# Patient Record
Sex: Male | Born: 1980 | Race: White | Hispanic: Yes | Marital: Single | Smoking: Never smoker
Health system: Southern US, Community
[De-identification: ages and names within clinical notes are randomized; demographics above are authoritative.]

---

## 2008-05-17 ENCOUNTER — Emergency Department (HOSPITAL_COMMUNITY): Admission: EM | Admit: 2008-05-17 | Discharge: 2008-05-17 | Payer: Self-pay | Admitting: Emergency Medicine

## 2018-03-19 ENCOUNTER — Emergency Department: Payer: Worker's Compensation

## 2018-03-19 ENCOUNTER — Emergency Department
Admission: EM | Admit: 2018-03-19 | Discharge: 2018-03-19 | Disposition: A | Discharge status: Home / Self Care | Payer: Worker's Compensation | Attending: Emergency Medicine | Admitting: Emergency Medicine

## 2018-03-19 ENCOUNTER — Other Ambulatory Visit: Payer: Self-pay

## 2018-03-19 ENCOUNTER — Encounter: Payer: Self-pay | Admitting: Intensive Care

## 2018-03-19 DIAGNOSIS — M545 Low back pain, unspecified: Secondary | ICD-10-CM

## 2018-03-19 DIAGNOSIS — S20221A Contusion of right back wall of thorax, initial encounter: Secondary | ICD-10-CM | POA: Diagnosis not present

## 2018-03-19 DIAGNOSIS — Y999 Unspecified external cause status: Secondary | ICD-10-CM | POA: Diagnosis not present

## 2018-03-19 DIAGNOSIS — Y939 Activity, unspecified: Secondary | ICD-10-CM | POA: Diagnosis not present

## 2018-03-19 DIAGNOSIS — S3991XA Unspecified injury of abdomen, initial encounter: Secondary | ICD-10-CM | POA: Insufficient documentation

## 2018-03-19 DIAGNOSIS — Y929 Unspecified place or not applicable: Secondary | ICD-10-CM | POA: Insufficient documentation

## 2018-03-19 DIAGNOSIS — S92011A Displaced fracture of body of right calcaneus, initial encounter for closed fracture: Secondary | ICD-10-CM | POA: Diagnosis not present

## 2018-03-19 DIAGNOSIS — W12XXXA Fall on and from scaffolding, initial encounter: Secondary | ICD-10-CM | POA: Diagnosis not present

## 2018-03-19 DIAGNOSIS — S20309A Unspecified superficial injuries of unspecified front wall of thorax, initial encounter: Secondary | ICD-10-CM | POA: Insufficient documentation

## 2018-03-19 DIAGNOSIS — S99921A Unspecified injury of right foot, initial encounter: Secondary | ICD-10-CM | POA: Diagnosis present

## 2018-03-19 LAB — CBC WITH DIFFERENTIAL/PLATELET
BASOS ABS: 0 10*3/uL (ref 0–0.1)
Basophils Relative: 0 %
EOS PCT: 0 %
Eosinophils Absolute: 0 10*3/uL (ref 0–0.7)
HEMATOCRIT: 44.5 % (ref 40.0–52.0)
Hemoglobin: 15.3 g/dL (ref 13.0–18.0)
LYMPHS ABS: 0.8 10*3/uL — AB (ref 1.0–3.6)
LYMPHS PCT: 6 %
MCH: 32.3 pg (ref 26.0–34.0)
MCHC: 34.4 g/dL (ref 32.0–36.0)
MCV: 94.1 fL (ref 80.0–100.0)
MONO ABS: 0.9 10*3/uL (ref 0.2–1.0)
MONOS PCT: 7 %
NEUTROS ABS: 11.8 10*3/uL — AB (ref 1.4–6.5)
Neutrophils Relative %: 87 %
Platelets: 203 10*3/uL (ref 150–440)
RBC: 4.74 MIL/uL (ref 4.40–5.90)
RDW: 13.5 % (ref 11.5–14.5)
WBC: 13.5 10*3/uL — ABNORMAL HIGH (ref 3.8–10.6)

## 2018-03-19 LAB — BASIC METABOLIC PANEL
Anion gap: 6 (ref 5–15)
BUN: 23 mg/dL — ABNORMAL HIGH (ref 6–20)
CHLORIDE: 109 mmol/L (ref 98–111)
CO2: 24 mmol/L (ref 22–32)
Calcium: 9.1 mg/dL (ref 8.9–10.3)
Creatinine, Ser: 0.82 mg/dL (ref 0.61–1.24)
GFR calc Af Amer: >60 mL/min (ref >60)
GLUCOSE: 140 mg/dL — AB (ref 70–99)
POTASSIUM: 4 mmol/L (ref 3.5–5.1)
Sodium: 139 mmol/L (ref 135–145)

## 2018-03-19 LAB — URINALYSIS, COMPLETE (UACMP) WITH MICROSCOPIC
BACTERIA UA: NONE SEEN
BILIRUBIN URINE: NEGATIVE
Glucose, UA: NEGATIVE mg/dL
Hgb urine dipstick: NEGATIVE
KETONES UR: NEGATIVE mg/dL
LEUKOCYTES UA: NEGATIVE
Nitrite: NEGATIVE
Protein, ur: NEGATIVE mg/dL
SQUAMOUS EPITHELIAL / LPF: NONE SEEN (ref 0–5)
pH: 5 (ref 5.0–8.0)

## 2018-03-19 MED ORDER — IOHEXOL 300 MG/ML  SOLN
100.0000 mL | Freq: Once | INTRAMUSCULAR | Status: AC | PRN
  Administered 2018-03-19: 100 mL via INTRAVENOUS
  Filled 2018-03-19: qty 100

## 2018-03-19 MED ORDER — FENTANYL CITRATE (PF) 100 MCG/2ML IJ SOLN
100.0000 ug | Freq: Once | INTRAMUSCULAR | Status: AC
  Administered 2018-03-19: 100 ug via INTRAVENOUS
  Filled 2018-03-19: qty 2

## 2018-03-19 MED ORDER — HYDROMORPHONE HCL 1 MG/ML IJ SOLN
1.0000 mg | Freq: Once | INTRAMUSCULAR | Status: AC
  Administered 2018-03-19: 1 mg via INTRAVENOUS
  Filled 2018-03-19: qty 1

## 2018-03-19 MED ORDER — ONDANSETRON HCL 4 MG/2ML IJ SOLN
4.0000 mg | Freq: Once | INTRAMUSCULAR | Status: AC
Start: 2018-03-19 — End: 2018-03-19
  Administered 2018-03-19: 4 mg via INTRAVENOUS
  Filled 2018-03-19: qty 2

## 2018-03-19 MED ORDER — MELOXICAM 15 MG PO TABS: 15.0000 mg | ORAL_TABLET | Freq: Every day | ORAL | 0 refills | Status: DC

## 2018-03-19 MED ORDER — OXYCODONE-ACETAMINOPHEN 10-325 MG PO TABS: 1.0000 | ORAL_TABLET | Freq: Four times a day (QID) | ORAL | 0 refills | Status: DC | PRN

## 2018-03-19 MED ORDER — OXYCODONE-ACETAMINOPHEN 5-325 MG PO TABS
1.0000 | ORAL_TABLET | ORAL | Status: DC | PRN
  Administered 2018-03-19: 1 via ORAL
  Filled 2018-03-19 (×2): qty 1

## 2018-03-19 NOTE — ED Notes (Signed)
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 Verbally confirmed no testing or specimen collection necessary.

## 2018-03-19 NOTE — ED Notes (Signed)
See triage note  Brought in by co-worker s/p falling from approx 9 ft  From scaffold  Landed on feet  Having pain to mainly ro right foot/heel  Positive swelling   Unable to bear wt

## 2018-03-19 NOTE — Discharge Instructions (Signed)
Do not put any weight on your right foot. Keep your foot elevated. Apply ice pack on top of splint 20 minutes per hour. If the pain gets bad and is not controlled by the pain medicine, come back to the ER.

## 2018-03-19 NOTE — ED Triage Notes (Signed)
Patient reports falling at work on Right ankle. Ankle swollen. Unable to put any weight on ankle.

## 2018-03-19 NOTE — ED Notes (Signed)
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  Pt unable to bear weight to right ankle.

## 2018-03-19 NOTE — ED Provider Notes (Signed)
[facility] Emergency Department Provider Note  ____________________________________________   First MD Initiated Contact with Patient 03/19/18 1607     (approximate)  I have reviewed the triage vital signs and the nursing notes.   HISTORY  Chief Complaint Ankle Pain (right)   HPI Peter Black is a 37 y.o. male who presents to the emergency department for treatment and evaluation of right ankle and foot pain after falling from 9 or 10 feet while up on a scaffold.  He lost his balance and fell.  He landed directly on both feet.  He has been unable to bear any weight on his left foot.   History reviewed. No pertinent past medical history.  There are no active problems to display for this patient.   History reviewed. No pertinent surgical history.  Prior to Admission medications   Medication Sig Start Date End Date Taking? Authorizing Provider  meloxicam (MOBIC) 15 MG tablet Take 1 tablet (15 mg total) by mouth daily. 03/19/18   Mesa Janus, Rulon Eisenmenger B, FNP  oxyCODONE-acetaminophen (PERCOCET) 10-325 MG tablet Take 1 tablet by mouth every 6 (six) hours as needed for pain. 03/19/18 03/19/19  Chinita Pester, FNP    Allergies Patient has no known allergies.  History reviewed. No pertinent family history.  Social History Social History   Tobacco Use  . Smoking status: Never Smoker  . Smokeless tobacco: Never Used  Substance Use Topics  . Alcohol use: Yes    Comment: occ  . Drug use: Never    Review of Systems  Constitutional: No fever/chills Eyes: No visual changes. ENT: No sore throat. Cardiovascular: Denies chest pain. Respiratory: Denies shortness of breath. Gastrointestinal: No abdominal pain.  No nausea, no vomiting.  No diarrhea.  No constipation. Genitourinary: Negative for dysuria. Musculoskeletal: Positive for back pain. Skin: Positive for right flank ecchymosis. Neurological: Negative for headaches, focal weakness or  numbness. ____________________________________________   PHYSICAL EXAM:  VITAL SIGNS: ED Triage Vitals  Enc Vitals Group     BP 03/19/18 1538 129/79     Pulse Rate 03/19/18 1538 81     Resp 03/19/18 1538 16     Temp 03/19/18 1538 99.2 F (37.3 C)     Temp Source 03/19/18 1538 Oral     SpO2 03/19/18 1538 96 %     Weight 03/19/18 1539 182 lb (82.6 kg)     Height 03/19/18 1539 5' 6.5" (1.689 m)     Head Circumference --      Peak Flow --      Pain Score 03/19/18 1539 10     Pain Loc --      Pain Edu? --      Excl. in GC? --     Constitutional: Alert and oriented. Well appearing and in no acute distress. Eyes: Conjunctivae are normal. Head: Atraumatic. Nose: No congestion/rhinnorhea. Mouth/Throat: Mucous membranes are moist.  Oropharynx non-erythematous. Neck: No stridor.   Cardiovascular: Normal rate, regular rhythm. Grossly normal heart sounds.  Good peripheral circulation. Respiratory: Normal respiratory effort.  No retractions. Lungs CTAB. Gastrointestinal: Soft and diffusely tender over the right abdomen. No distention. No abdominal bruits. No CVA tenderness. Musculoskeletal: No focal midline tenderness over the cervical, thoracic, or lumbar spine. Diffuse edema over the right ankle and foot. Full ROM of the right and left knee. No pain with pelvic rock.  Neurologic:  Normal speech and language. No gross focal neurologic deficits are appreciated. Skin:  Ecchymosis over the right flank with scattered abrasions. Psychiatric:  Mood and affect are normal. Speech and behavior are normal.  ____________________________________________   LABS (all labs ordered are listed, but only abnormal results are displayed)  Labs Reviewed  BASIC METABOLIC PANEL - Abnormal; Notable for the following components:      Result Value   Glucose, Bld 140 (*)    BUN 23 (*)    All other components within normal limits  CBC WITH DIFFERENTIAL/PLATELET - Abnormal; Notable for the following  components:   WBC 13.5 (*)    Neutro Abs 11.8 (*)    Lymphs Abs 0.8 (*)    All other components within normal limits  URINALYSIS, COMPLETE (UACMP) WITH MICROSCOPIC - Abnormal; Notable for the following components:   Color, Urine YELLOW (*)    APPearance CLEAR (*)    Specific Gravity, Urine >1.046 (*)    All other components within normal limits   ____________________________________________  EKG  Not indicated. ____________________________________________  RADIOLOGY  ED MD interpretation: Comminuted right calcaneal fracture.  All other images are negative for acute findings.  Official radiology report(s): Dg Cervical Spine 2-3 Views  Result Date: 03/19/2018 CLINICAL DATA:  Patient slipped off 9 foot scaffold and presents with neck and low back pain. EXAM: CERVICAL SPINE - 2-3 VIEW COMPARISON:  None. FINDINGS: There is no evidence of cervical spine fracture or prevertebral soft tissue swelling. Alignment is normal. No other significant bone abnormalities are identified. IMPRESSION: Negative cervical spine radiographs. Electronically Signed   By: Tollie Eth M.D.   On: 03/19/2018 18:49   Dg Lumbar Spine 2-3 Views  Result Date: 03/19/2018 CLINICAL DATA:  Low back pain after fall from scaffolding. EXAM: LUMBAR SPINE - 2-3 VIEW COMPARISON:  Same day CT of the abdomen and pelvis. FINDINGS: There is no evidence of lumbar spine fracture. Small riblets of L1 bilaterally. Alignment is normal. Slight relative disc flattening L4-5 and L5-S1. No listhesis or pars defects. Contrast noted within the renal collecting system. IMPRESSION: Slight disc space narrowing L4-5 and L5-S1. No acute osseous abnormality. Electronically Signed   By: Tollie Eth M.D.   On: 03/19/2018 18:52   Dg Ankle Complete Right  Result Date: 03/19/2018 CLINICAL DATA:  RIGHT ankle injury. EXAM: RIGHT ANKLE - COMPLETE 3+ VIEW COMPARISON:  None. FINDINGS: Soft tissue swelling overlying the lateral malleolus. Osseous structures  about the RIGHT ankle appear intact and normally aligned. Ankle mortise is symmetric. Fracture lines are present within the proximal and mid calcaneus, possible fracture extension to the posterior calcaneus. IMPRESSION: 1. Displaced/comminuted fractures of the anterior/mid calcaneus, with possible extension of fracture line to the posterior calcaneus. Recommend CT to evaluate full extent. 2. Osseous structures about the RIGHT ankle appear intact and normally aligned. 3. Soft tissue swelling. Electronically Signed   By: Bary Richard M.D.   On: 03/19/2018 16:45   Ct Chest W Contrast  Result Date: 03/19/2018 CLINICAL DATA:  Fall from approximately 9 feet, landed on feet. Abdominal trauma, blunt, stable. EXAM: CT CHEST, ABDOMEN, AND PELVIS WITH CONTRAST TECHNIQUE: Multidetector CT imaging of the chest, abdomen and pelvis was performed following the standard protocol during bolus administration of intravenous contrast. CONTRAST:  OMNIPAQUE IOHEXOL 300 MG/ML  SOLN COMPARISON:  None. FINDINGS: CT CHEST FINDINGS Cardiovascular: Thoracic aorta appears intact and normal in configuration. Heart size is normal. No pericardial effusion. Mediastinum/Nodes: No hemorrhage or edema within the mediastinum. No mass or enlarged lymph nodes within the mediastinum or perihilar regions. Esophagus appears normal. Trachea and central bronchi are unremarkable. Lungs/Pleura: Lungs are clear.  No pleural effusion or pneumothorax. Musculoskeletal: No osseous fracture or dislocation seen. CT ABDOMEN PELVIS FINDINGS Hepatobiliary: No hepatic injury or perihepatic hematoma. Gallbladder is unremarkable Pancreas: Unremarkable. No pancreatic ductal dilatation or surrounding inflammatory changes. Spleen: No splenic injury or perisplenic hematoma. Adrenals/Urinary Tract: No adrenal hemorrhage or renal injury identified. Bladder is unremarkable. Stomach/Bowel: No dilated large or small bowel loops. No bowel wall thickening or evidence of bowel  wall injury. Appendix is normal. Stomach is unremarkable. Vascular/Lymphatic: Abdominal aorta appears intact and normal in configuration. No evidence of vascular injury within the abdomen or pelvis. No enlarged lymph nodes seen in the abdomen or pelvis. Reproductive: Prostate is unremarkable. Other: No free fluid or hemorrhage within the abdomen or pelvis. No free intraperitoneal air. Musculoskeletal: No osseous fracture or dislocation seen. Incidental note made of a partial sacralization of the lowest lumbar vertebral body on the LEFT. IMPRESSION: 1. No acute findings within the chest, abdomen or pelvis. 2. No osseous fracture or dislocation seen. Dedicated thoracic spine CT report to follow. Electronically Signed   By: Bary Richard M.D.   On: 03/19/2018 18:22   Ct Abdomen Pelvis W Contrast  Result Date: 03/19/2018 CLINICAL DATA:  Fall from approximately 9 feet, landed on feet. Abdominal trauma, blunt, stable. EXAM: CT CHEST, ABDOMEN, AND PELVIS WITH CONTRAST TECHNIQUE: Multidetector CT imaging of the chest, abdomen and pelvis was performed following the standard protocol during bolus administration of intravenous contrast. CONTRAST:  OMNIPAQUE IOHEXOL 300 MG/ML  SOLN COMPARISON:  None. FINDINGS: CT CHEST FINDINGS Cardiovascular: Thoracic aorta appears intact and normal in configuration. Heart size is normal. No pericardial effusion. Mediastinum/Nodes: No hemorrhage or edema within the mediastinum. No mass or enlarged lymph nodes within the mediastinum or perihilar regions. Esophagus appears normal. Trachea and central bronchi are unremarkable. Lungs/Pleura: Lungs are clear.  No pleural effusion or pneumothorax. Musculoskeletal: No osseous fracture or dislocation seen. CT ABDOMEN PELVIS FINDINGS Hepatobiliary: No hepatic injury or perihepatic hematoma. Gallbladder is unremarkable Pancreas: Unremarkable. No pancreatic ductal dilatation or surrounding inflammatory changes. Spleen: No splenic injury or  perisplenic hematoma. Adrenals/Urinary Tract: No adrenal hemorrhage or renal injury identified. Bladder is unremarkable. Stomach/Bowel: No dilated large or small bowel loops. No bowel wall thickening or evidence of bowel wall injury. Appendix is normal. Stomach is unremarkable. Vascular/Lymphatic: Abdominal aorta appears intact and normal in configuration. No evidence of vascular injury within the abdomen or pelvis. No enlarged lymph nodes seen in the abdomen or pelvis. Reproductive: Prostate is unremarkable. Other: No free fluid or hemorrhage within the abdomen or pelvis. No free intraperitoneal air. Musculoskeletal: No osseous fracture or dislocation seen. Incidental note made of a partial sacralization of the lowest lumbar vertebral body on the LEFT. IMPRESSION: 1. No acute findings within the chest, abdomen or pelvis. 2. No osseous fracture or dislocation seen. Dedicated thoracic spine CT report to follow. Electronically Signed   By: Bary Richard M.D.   On: 03/19/2018 18:22   Ct Foot Right Wo Contrast  Result Date: 03/19/2018 CLINICAL DATA:  Fall from 9 foot scaffold. EXAM: CT OF THE RIGHT FOOT WITHOUT CONTRAST TECHNIQUE: Multidetector CT imaging of the right foot was performed according to the standard protocol. Multiplanar CT image reconstructions were also generated. COMPARISON:  None. FINDINGS: Markedly displaced/comminuted fractures throughout the calcaneus, most significant displacement within the central and lateral aspects of the mid calcaneus, up to 1.4 cm fracture diastasis. Fracture lines extend to the posterior cortex of the calcaneus at multiple sites, superior to inferior. There is fracture extension  into the anterior third of the calcaneus, however, there is no fracture extension to the anterior margin of the sustentacular talus. Alignment at the calcaneocuboid joint space is normal. There is at least mild associated diastasis at the middle and posterior subtalar joint spaces. Osseous  structures of the midfoot appear intact and normally aligned throughout. Metatarsal bones and phalanges appear intact and normally aligned throughout. Talus appears intact and normally aligned. IMPRESSION: 1. Markedly displaced/comminuted fractures extending throughout the calcaneus, as detailed above. 2. Osseous structures of the midfoot and forefoot appear intact and normally aligned throughout. Electronically Signed   By: Bary Richard M.D.   On: 03/19/2018 18:31   Ct T-spine No Charge  Result Date: 03/19/2018 CLINICAL DATA:  Fall from 9 foot height.  Acute back pain. EXAM: CT THORACIC SPINE WITHOUT CONTRAST TECHNIQUE: Multidetector CT images of the thoracic were obtained using the standard protocol without intravenous contrast. COMPARISON:  None. FINDINGS: Alignment is normal. No fracture line or displaced fracture fragment. No compression fracture deformity. Visualized portions of the posterior ribs appear intact and normally aligned. The immediate paravertebral soft tissues are unremarkable. No paravertebral hemorrhage or edema. Minimal degenerative spurring within the midthoracic spine. Disc spaces appear otherwise well-preserved throughout. No significant central canal stenosis at any level. IMPRESSION: No acute or significant findings. No fracture or dislocation within the thoracic spine. Electronically Signed   By: Bary Richard M.D.   On: 03/19/2018 18:25    ____________________________________________   PROCEDURES  Procedure(s) performed:   .Splint Application Date/Time: 03/19/2018 9:05 PM Performed by: Lina Sayre, NT Authorized by: Chinita Pester, FNP   Consent:    Consent obtained:  Verbal   Consent given by:  Patient   Risks discussed:  Numbness and pain Pre-procedure details:    Sensation:  Normal Procedure details:    Laterality:  Right   Location:  Foot   Foot:  R foot   Splint type:  Short leg   Supplies:  Elastic bandage, cotton padding and  Ortho-Glass Post-procedure details:    Pain:  Unchanged   Sensation:  Normal   Patient tolerance of procedure:  Tolerated well, no immediate complications    Critical Care performed: No  ____________________________________________   INITIAL IMPRESSION / ASSESSMENT AND PLAN / ED COURSE  As part of my medical decision making, I reviewed the following data within the electronic MEDICAL RECORD NUMBER Interpreter needed   37 year old male presents to the emergency department after slipping off a scaffold.  He landed on both feet and then fell backward.  Do to the mechanism of injury and exam findings, CT chest abdomen and pelvis has been requested as well as plain films of the cervical, thoracic, lumbar spine.  Case was discussed with Dr. Orland Jarred who advises to place an Ace bandage over the foot and ankle then apply OCL.  He advises that Dr. Ether Griffins is the provider that will see him in follow-up due to calcaneal fracture for which he is able to further evaluate.  Results of all the testing was discussed with the patient and his family via interpreter.  The patient is aware that he would need to call Dr. Irene Limbo office tomorrow to schedule an appointment.      ____________________________________________   FINAL CLINICAL IMPRESSION(S) / ED DIAGNOSES  Final diagnoses:  Acute lumbar back pain  Closed displaced fracture of body of right calcaneus, initial encounter  Contusion of right back wall of thorax, initial encounter     ED Discharge Orders  Ordered    oxyCODONE-acetaminophen (PERCOCET) 10-325 MG tablet  Every 6 hours PRN     03/19/18 2050    meloxicam (MOBIC) 15 MG tablet  Daily     03/19/18 2050           Note:  This document was prepared using Dragon voice recognition software and may include unintentional dictation errors.    Chinita Pesterriplett, Rashod Gougeon B, FNP 03/19/18 2116    Dionne BucySiadecki, Sebastian, MD 03/19/18 [phone]

## 2018-03-23 ENCOUNTER — Ambulatory Visit (INDEPENDENT_AMBULATORY_CARE_PROVIDER_SITE_OTHER): Payer: Worker's Compensation | Admitting: Podiatry

## 2018-03-23 ENCOUNTER — Other Ambulatory Visit: Payer: Self-pay

## 2018-03-23 ENCOUNTER — Encounter: Payer: Self-pay | Admitting: Podiatry

## 2018-03-23 VITALS — BP 101/54 | HR 51

## 2018-03-23 DIAGNOSIS — S92011A Displaced fracture of body of right calcaneus, initial encounter for closed fracture: Secondary | ICD-10-CM

## 2018-03-23 DIAGNOSIS — R600 Localized edema: Secondary | ICD-10-CM

## 2018-03-23 MED ORDER — OXYCODONE-ACETAMINOPHEN 10-325 MG PO TABS: 1.0000 | ORAL_TABLET | ORAL | 0 refills | Status: DC | PRN

## 2018-03-25 NOTE — Progress Notes (Signed)
Subjective:   Patient ID: Peter Black, male   DOB: 37 y.o.   MRN: [id number]   HPI Patient presents with interpreter after suffering a severe injury to the right ankle at work where he fell off a ladder while working last Thursday.  He was seen in the emergency room and had a splint put on and was sent straight to us knowing that he is getting need surgery on his heel bone.  Patient is nonweightbearing and patient does not smoke and likes to be active   Review of Systems  All other systems reviewed and are negative.       Objective:  Physical Exam  Constitutional: He appears well-developed and well-nourished.  Cardiovascular: Intact distal pulses.  Pulmonary/Chest: Effort normal.  Musculoskeletal: Normal range of motion.  Neurological: He is alert.  Skin: Skin is warm.  Nursing note and vitals reviewed.   Neurovascular status was found to be intact with significant swelling of the right heel with severe pain noted and injury.  He presents with x-rays today that were evaluated and showed a severe comminuted fracture of the right calcaneus with depression of the sustentaculum tali and loss of integrity currently of the subtalar joint.  Everything is very tender and the edema is +2 pitting currently.  He has negative Homans sign and no breaks in his skin     Assessment:  Severe comminuted fracture of his right calcaneus at work sustained     Plan:  H&P and reviewed this case with several other physicians in the group.  I reviewed it with patient at great length and we discussed the nature of this fracture and the fact that it will not heal without surgery and that the calcaneus will need to be lifted in order to try to provide integrity and that long-term it is possible he may require a subtalar joint fusion.  Patient wants surgery and first we need to get the swelling down and we need to get the weight off of this foot so I did go ahead and I placed him into an Unna boot  to try to provide for reduced edema and I applied air fracture walker to try to stabilize the fracture.  Patient will be seen back in 1 week and will tentatively be scheduled for surgery in the next 10 days and this will be provided by several doctors in our group who will be available

## 2018-03-26 ENCOUNTER — Telehealth: Payer: Self-pay | Admitting: *Deleted

## 2018-03-26 NOTE — Telephone Encounter (Signed)
I called and left the patient a message that Dr. Charlsie Merlesegal was referring him to Dr. Gala LewandowskyBrent Evans and that he had an appointment with Dr. Logan BoresEvans on Monday, 03/30/2018, at 9:45 am.  I also informed him that we have him scheduled for surgery on Thursday, 04/02/2018, at 7 am.  I told him we will give him all the detailed information when he's here for his appointment on Monday.  I asked him to call if he has any questions.  Mr. Peter Black called me back.  I informed him about the referral made by Dr. Charlsie Merlesegal to Dr. Logan BoresEvans, as well as the surgery date of 04/02/2018.  I asked him for the name of the adjustor for his worker's compensation case.  He did not know the name but said he works for [employer] and that [employer] insurance should cover his procedure.

## 2018-03-27 NOTE — Telephone Encounter (Signed)
I left a message for Kasandra KnudsenCarol Jakubowski to give me a call back.  I informed her that the patient is scheduled for surgery on Thursday, 04/02/2018.  We need to get authorization for the surgery.

## 2018-03-30 ENCOUNTER — Ambulatory Visit (INDEPENDENT_AMBULATORY_CARE_PROVIDER_SITE_OTHER): Payer: Worker's Compensation | Admitting: Podiatry

## 2018-03-30 DIAGNOSIS — S92011A Displaced fracture of body of right calcaneus, initial encounter for closed fracture: Secondary | ICD-10-CM | POA: Diagnosis not present

## 2018-03-30 DIAGNOSIS — R600 Localized edema: Secondary | ICD-10-CM

## 2018-03-30 NOTE — Progress Notes (Signed)
   HPI: 37 year old male presents the office today for surgical consultation regarding calcaneal fracture with comminution to the right foot.  Patient sustained a work-related injury when he fell off some scaffolding.  DOI : 03/19/18.  Patient was seen in the emergency room and had a splint placed and [facility].  CT scan was ordered demonstrating comminution with severe fragmentation of the calcaneus with displacement.  Patient is already scheduled for surgery on Thursday, 04/02/2018.  He presents today for surgical consultation and to evaluate the edema to the right ankle and foot.  No past medical history on file.   Physical Exam: General: The patient is alert and oriented x3 in no acute distress.  Dermatology: Skin is warm, dry and supple bilateral lower extremities. Negative for open lesions or macerations.  Vascular: Palpable pedal pulses bilaterally.  Capillary refill within normal limits.  Edema appears to be somewhat improved since the date of injury.  Okay to proceed with surgical intervention at this time.  Ecchymosis also noted throughout the right foot and ankle  Neurological: Epicritic and protective threshold grossly intact bilaterally.   Musculoskeletal Exam: Pain throughout the right foot.  Patient is currently nonweightbearing in the immobilization cam boot using crutches  Radiographic Exam:  Normal osseous mineralization. Joint spaces preserved. No fracture/dislocation/boney destruction.    Assessment: 1.  Displaced calcaneal fracture right with comminution   Plan of Care:  1. Patient evaluated. X-Rays and CT reviewed.  2.  Surgery scheduled for Thursday, 04/02/2018 7 AM.  Authorization for surgery has already been initiated and approved 3.  I did explain to the patient that he will require several months refraining from work postoperatively.  We will likely place an immobilization fiberglass cast on the  patient's foot for the first 4 weeks.  Strict nonweightbearing for approximately 6 to 8 weeks. 4.  Return to clinic 1 week postop      Felecia ShellingBrent M. Nala Kachel, DPM [facility]  Dr. Felecia ShellingBrent M. Melani Brisbane, DPM    [address]                                        [place], [place] [id number]                Office [phone]  Fax [phone]

## 2018-03-31 ENCOUNTER — Telehealth: Payer: Self-pay | Admitting: *Deleted

## 2018-03-31 NOTE — Telephone Encounter (Signed)
"  Does Dr. Logan BoresEvans need anything, implants or whatever, for his procedure that is scheduled for Thursday?  If so, we need to get the order in so we will have it by Thursday.  Please call and let LuAnn or Renee know.

## 2018-03-31 NOTE — Telephone Encounter (Signed)
"  My name is Peter RegalCarol calling from [employer].  I am working on a claim for [employer] Group[employer] Black.  He's scheduled for a surgery on 04/02/2018.  I have not received any medical records for him or anything.  Can you fax that information to me?"  Yes, I will fax it.  What is your fax number?  "It is [phone].  Make sure when you send the notes to put the claim number on the front page."  I will send the now.

## 2018-03-31 NOTE — Telephone Encounter (Signed)
I asked the administrators at the front desk to let me know when the patient comes in,so we can assure that the consent form is explained in detail.

## 2018-03-31 NOTE — Telephone Encounter (Signed)
I called and asked Mr. Peter Black to stop by the office to sign his consent forms prior to surgery.  He informed me that he was here yesterday and that he signed them.  I informed him I did not receive them.  He said he signed them when he left yesterday.  He said he did not have a way to get here because his wife was at work.  I asked him if he could find someone else to bring him.  He said he would try to find someone to bring him today.  I informed him that he can come either today or tomorrow.  I left the consent form at the front desk.

## 2018-03-31 NOTE — Telephone Encounter (Signed)
I don't think it is a great idea to leave at the front desk for him to sign. I would like to make sure that the consent form has been thoroughly reviewed with the patient.

## 2018-04-01 NOTE — Telephone Encounter (Signed)
Okay great, thanks

## 2018-04-01 NOTE — Telephone Encounter (Signed)
I tried to call Diedre again regarding the authorization for surgery.  I left her a message to call me back as soon as possible.

## 2018-04-01 NOTE — Telephone Encounter (Signed)
I left a message for the Claim Adjustor to give me a call back regarding authorization of Peter Black's surgery that's scheduled for tomorrow.

## 2018-04-01 NOTE — Telephone Encounter (Signed)
I called and left a message for Peter Black to give me a call back regarding authorization of Peter Black's surgery, that is scheduled for tomorrow at 7 am.  I informed her we will need to cancel it if it's not authorized.  The surgical center is calling inquiring about the authorization.

## 2018-04-01 NOTE — Telephone Encounter (Signed)
Dr. Logan BoresEvans said she spoke to the surgery center about it last night and they and the sales rep are both aware.

## 2018-04-02 ENCOUNTER — Encounter: Payer: Self-pay | Admitting: Podiatry

## 2018-04-02 DIAGNOSIS — S92011A Displaced fracture of body of right calcaneus, initial encounter for closed fracture: Secondary | ICD-10-CM | POA: Diagnosis not present

## 2018-04-02 NOTE — Telephone Encounter (Signed)
I called and informed Deidre [place] at [facility] that they are going to do Mr. Marye RoundVillanueva's surgery today at 11:30 am.

## 2018-04-02 NOTE — Telephone Encounter (Signed)
I tried several times to call Garnette Czecheidre Kinville at [facility] to get authorization for his surgery this morning with no success.  I left her messages to call me back.

## 2018-04-02 NOTE — Telephone Encounter (Addendum)
Okey RegalCarol from [facility] returned my call.  She said she had given the case to Deidre because she saw that the patient needed surgery. She stated we should not have scheduled the surgery until they had reviewed everything.  She said they needed more time to process the claim.  I informed her that we saw the patient on 03/23/2018.  We did not know the claim number,  who the adjustor was, have an address, or a phone number when we saw the patient.  We had requested the information and finally got your name and number. She checked the notes in the system and saw that Deidre's supervisor had just authorized the surgery at 8:40 am.  She said they had [facility].  She asked if I worked there.  I informed her no, I work at the surgeon's office.  She asked when would the surgery be rescheduled.  I informed her I did not know at this time because Dr. Ardelle AntonWagoner was going to be assisting the Dr. Logan BoresEvans.  We actually rearranged his schedule for today to implement this surgery.  She asked that I let them know when the patient will be rescheduled for his surgery.    I called Aram BeechamCynthia and informed her that the surgery had been approved.  I told her that Dr. Ardelle AntonWagoner said he could come back to assist around 11:30 am and help for about a hour with the case.  She said she would see if it could be added there.   I called Aram BeechamCynthia again and inquired.  She said she would call me back.  She was going to ask one of the nurses to ask if it was okay.  Aram BeechamCynthia returned my call and stated they said yes, it would be okay to do the surgery at 11:30 am.

## 2018-04-06 ENCOUNTER — Telehealth: Payer: Self-pay | Admitting: Podiatry

## 2018-04-06 NOTE — Telephone Encounter (Signed)
Pt needs an anti-biotic. He did receive pain medication. He also wanted to know if he can come pick it up in the office today.

## 2018-04-06 NOTE — Telephone Encounter (Signed)
Pt states he needs an antibiotic in case he gets an infection. I asked pt if he had any symptoms of infection and he said no but he didn't want any. I told pt he received antibiotics in his IV. Pt states understanding, then stated he had some blood on the gauze. I asked if it was wet and he stated yes. I told pt I would get him an appt to come in tomorrow, to stay in the surgical boot. I attempted to transfer pt to schedule, but the line was dropped, so I routed a message to schedulers.

## 2018-04-08 ENCOUNTER — Ambulatory Visit (INDEPENDENT_AMBULATORY_CARE_PROVIDER_SITE_OTHER): Payer: Self-pay | Admitting: Podiatry

## 2018-04-08 ENCOUNTER — Ambulatory Visit (INDEPENDENT_AMBULATORY_CARE_PROVIDER_SITE_OTHER): Payer: Worker's Compensation

## 2018-04-08 VITALS — Temp 97.7°F

## 2018-04-08 DIAGNOSIS — S92011A Displaced fracture of body of right calcaneus, initial encounter for closed fracture: Secondary | ICD-10-CM

## 2018-04-08 DIAGNOSIS — Z9889 Other specified postprocedural states: Secondary | ICD-10-CM

## 2018-04-08 MED ORDER — CEPHALEXIN 500 MG PO CAPS: 500.0000 mg | ORAL_CAPSULE | Freq: Three times a day (TID) | ORAL | 0 refills | Status: AC

## 2018-04-08 MED ORDER — MELOXICAM 15 MG PO TABS: 15.0000 mg | ORAL_TABLET | Freq: Every day | ORAL | 0 refills | Status: DC

## 2018-04-15 ENCOUNTER — Ambulatory Visit (INDEPENDENT_AMBULATORY_CARE_PROVIDER_SITE_OTHER): Payer: Self-pay | Admitting: Podiatry

## 2018-04-15 DIAGNOSIS — S92011A Displaced fracture of body of right calcaneus, initial encounter for closed fracture: Secondary | ICD-10-CM

## 2018-04-15 DIAGNOSIS — Z9889 Other specified postprocedural states: Secondary | ICD-10-CM

## 2018-04-15 MED ORDER — OXYCODONE-ACETAMINOPHEN 10-325 MG PO TABS: 1.0000 | ORAL_TABLET | ORAL | 0 refills | Status: DC | PRN

## 2018-04-17 ENCOUNTER — Other Ambulatory Visit: Payer: Self-pay | Admitting: Podiatry

## 2018-04-17 ENCOUNTER — Telehealth: Payer: Self-pay | Admitting: *Deleted

## 2018-04-17 MED ORDER — OXYCODONE-ACETAMINOPHEN 10-325 MG PO TABS: 1.0000 | ORAL_TABLET | Freq: Four times a day (QID) | ORAL | 0 refills | Status: DC | PRN

## 2018-04-17 MED ORDER — OXYCODONE-ACETAMINOPHEN 10-325 MG PO TABS: 1.0000 | ORAL_TABLET | ORAL | 0 refills | Status: DC | PRN

## 2018-04-17 NOTE — Progress Notes (Signed)
Refilled pain medication as Dr. Logan Bores DEA is expired. Refilled what he had prescribed.

## 2018-04-17 NOTE — Progress Notes (Signed)
   Subjective:  Patient presents today status post ORIF calcaneal fracture right. DOS: 04/02/18. He states he is doing well overall. He has been wearing the CAM boot as directed and taking Percocet for pain as directed. There are no modifying factors noted. Patient is here for further evaluation and treatment.    No past medical history on file.    Objective/Physical Exam Neurovascular status intact.  Skin incisions appear to be well coapted with sutures and staples intact. No sign of infectious process noted. No dehiscence. No active bleeding noted. Moderate edema noted to the surgical extremity.  Assessment: 1. s/p ORIF calcaneal fracture right. DOS: 04/02/18.    Plan of Care:  1. Patient was evaluated.  2. Dressing changed. Keep clean, dry and intact for one week.  3. Continue nonweightbearing in CAM boot.  4. Refill prescription for Percocet 5/325 mg provided to patient.  5. Return to clinic in one week.    Felecia Shelling, DPM [facility]  Dr. Felecia Shelling, DPM    [address]                                        [place], [place] [address]                Office [phone]  Fax [phone]

## 2018-04-17 NOTE — Telephone Encounter (Signed)
Peter Black - [facility] states Dr. Logan Bores DEA has expired and pt's percocet can not be filled.

## 2018-04-22 ENCOUNTER — Ambulatory Visit (INDEPENDENT_AMBULATORY_CARE_PROVIDER_SITE_OTHER): Payer: Self-pay | Admitting: Podiatry

## 2018-04-22 DIAGNOSIS — Z9889 Other specified postprocedural states: Secondary | ICD-10-CM

## 2018-04-22 DIAGNOSIS — S92011A Displaced fracture of body of right calcaneus, initial encounter for closed fracture: Secondary | ICD-10-CM

## 2018-04-22 MED ORDER — OXYCODONE-ACETAMINOPHEN 10-325 MG PO TABS: 1.0000 | ORAL_TABLET | Freq: Four times a day (QID) | ORAL | 0 refills | Status: DC | PRN

## 2018-04-29 ENCOUNTER — Ambulatory Visit (INDEPENDENT_AMBULATORY_CARE_PROVIDER_SITE_OTHER): Payer: Self-pay | Admitting: Podiatry

## 2018-04-29 ENCOUNTER — Telehealth: Payer: Self-pay | Admitting: *Deleted

## 2018-04-29 DIAGNOSIS — Z9889 Other specified postprocedural states: Secondary | ICD-10-CM

## 2018-04-29 DIAGNOSIS — S92011A Displaced fracture of body of right calcaneus, initial encounter for closed fracture: Secondary | ICD-10-CM

## 2018-04-29 NOTE — Telephone Encounter (Signed)
-----   Message from Brent M Evans, DPM sent at 04/29/2018  4:25 PM EDT ----- Regarding: Knee scooter Can we approve this patient for a knee scooter... He's been on crutches since surgery and he's hoping he can have a knee scooter.   Dx: s/p ORIF RT calcaneal fracture. DOS: 04/02/18 Strict NWB RLE x 4 weeks  Thanks, Dr. Evans 

## 2018-04-29 NOTE — Progress Notes (Signed)
   Subjective:  Patient presents today status post ORIF calcaneal fracture right. DOS: 04/02/18. He reports continued pain and associated swelling. He states he finished the course of antibiotics yesterday. There are no modifying factors noted. Patient is here for further evaluation and treatment.    No past medical history on file.    Objective/Physical Exam Neurovascular status intact.  Skin incisions appear to be well coapted with sutures and staples intact. No sign of infectious process noted. No dehiscence. No active bleeding noted. Moderate edema noted to the surgical extremity.  Assessment: 1. s/p ORIF calcaneal fracture right. DOS: 04/02/18.    Plan of Care:  1. Patient was evaluated.  2. Partial staples removed. Dry sterile dressing applied.  3. Continue nonweightbearing in CAM boot with crutches.  4. Authorization request for knee scooter placed.  5. Return to clinic in one week.   Felecia ShellingBrent M. Evans, DPM [facility]  Dr. Felecia ShellingBrent M. Evans, DPM    [address]                                        [place], [place] [address]                Office [phone]  Fax [phone]

## 2018-04-29 NOTE — Telephone Encounter (Signed)
-----   Message from Felecia ShellingBrent M Evans, DPM sent at 04/29/2018  4:25 PM EDT ----- Regarding: Knee scooter Can we approve this patient for a knee scooter... He's been on crutches since surgery and he's hoping he can have a knee scooter.   Dx: s/p ORIF RT calcaneal fracture. DOS: 04/02/18 Strict NWB RLE x 4 weeks  Thanks, Dr. Logan BoresEvans

## 2018-04-29 NOTE — Telephone Encounter (Signed)
Order for knee scooter faxed to [facility] and emailed to A. Baron Hamperrotter.

## 2018-05-02 NOTE — Progress Notes (Signed)
   Subjective:  Patient presents today status post ORIF calcaneal fracture right. DOS: 04/02/18. He states he is doing well. He is here to have the remaining staples removed and is concerned the wound will open. He denies any new complaints at this time.There are no modifying factors noted. He has been using the CAM boot without issue. Patient is here for further evaluation and treatment.    No past medical history on file.    Objective/Physical Exam Neurovascular status intact.  Skin incisions appear to be well coapted with remaining sutures and staples intact. No sign of infectious process noted. No dehiscence. No active bleeding noted. Moderate edema noted to the surgical extremity.  Assessment: 1. s/p ORIF calcaneal fracture right. DOS: 04/02/18.    Plan of Care:  1. Patient was evaluated.  2. Remaining staples/sutures removed.  3. Continue nonweightbearing in CAM boot.  4. Request sent today for knee scooter.  5. Return to clinic in 3 weeks for follow up X-Ray and begin weightbearing in CAM boot.    Felecia ShellingBrent M. Evans, DPM [facility]  Dr. Felecia ShellingBrent M. Evans, DPM    [address]                                        [place], [place] [address]                Office [phone]  Fax [phone]

## 2018-05-05 NOTE — Telephone Encounter (Signed)
Peter FothergillAngela Trotter - [facility] states pt declined the knee scooter, because [facility] does not bill Workers' Comp.

## 2018-05-15 NOTE — Progress Notes (Signed)
DOS: 04/02/2018 Open Reduction Internal Fixation RT Calcaneal  Dr. Logan Bores  [facility]

## 2018-05-20 ENCOUNTER — Ambulatory Visit (INDEPENDENT_AMBULATORY_CARE_PROVIDER_SITE_OTHER): Payer: Self-pay | Admitting: Podiatry

## 2018-05-20 ENCOUNTER — Ambulatory Visit (INDEPENDENT_AMBULATORY_CARE_PROVIDER_SITE_OTHER): Payer: Worker's Compensation

## 2018-05-20 DIAGNOSIS — S92011A Displaced fracture of body of right calcaneus, initial encounter for closed fracture: Secondary | ICD-10-CM | POA: Diagnosis not present

## 2018-05-20 DIAGNOSIS — S92011D Displaced fracture of body of right calcaneus, subsequent encounter for fracture with routine healing: Secondary | ICD-10-CM

## 2018-05-20 MED ORDER — OXYCODONE-ACETAMINOPHEN 10-325 MG PO TABS: 1.0000 | ORAL_TABLET | Freq: Four times a day (QID) | ORAL | 0 refills | Status: AC | PRN

## 2018-05-31 NOTE — Progress Notes (Signed)
   Subjective:  Patient presents today status post ORIF calcaneal fracture right. DOS: 04/02/18. He states he is doing well.  Patient states that he is feeling well and he believes that he is healing properly.  No new complaints at this time.  No past medical history on file.    Objective/Physical Exam Neurovascular status intact.  There is a small area of dehiscence along the incision site measuring approximate 1 cm in length.  Superficial in nature with. No sign of infectious process noted. No active bleeding noted. Moderate edema noted to the surgical extremity.  Radiographic exam: Orthopedic hardware intact.  Routine healing noted.  No malalignment of the calcaneus.  Assessment: 1. s/p ORIF calcaneal fracture right. DOS: 04/02/18.    Plan of Care:  1. Patient was evaluated.  2.  Today we are going to order physical therapy 3 times per week x4 weeks 3.  Begin light weightbearing in the immobilization cam boot 4.  Light debridement of the dehiscence site was performed with Betadine and dry sterile dressing applied.  Recommend antibiotic ointment and light dressing daily 5.  Refill prescription for pain medication 6.  Return to clinic in 3 weeks  Felecia Shelling, DPM [facility]  Dr. Felecia Shelling, DPM    [address]                                        [place], [place] [address]                Office [phone]  Fax [phone]

## 2018-06-10 ENCOUNTER — Ambulatory Visit (INDEPENDENT_AMBULATORY_CARE_PROVIDER_SITE_OTHER): Payer: Self-pay | Admitting: Podiatry

## 2018-06-10 ENCOUNTER — Ambulatory Visit (INDEPENDENT_AMBULATORY_CARE_PROVIDER_SITE_OTHER): Payer: Worker's Compensation

## 2018-06-10 ENCOUNTER — Encounter: Payer: Self-pay | Admitting: Podiatry

## 2018-06-10 DIAGNOSIS — S92011D Displaced fracture of body of right calcaneus, subsequent encounter for fracture with routine healing: Secondary | ICD-10-CM

## 2018-06-12 ENCOUNTER — Telehealth: Payer: Self-pay | Admitting: Podiatry

## 2018-06-12 NOTE — Telephone Encounter (Signed)
This is [name], Financial risk analyst with [employer]. If you could fax me his last office visit note as well as a work note to [phone]. If you have any questions, my phone number is [phone].

## 2018-06-12 NOTE — Telephone Encounter (Signed)
This is Deidra calling from [employer]. We need all office visit notes from  August 2019 - Present faxed to [place] as soon as possible. The fax number is [phone] and reference workers' compensation claim number [id number]. If you have any questions, I can be reached at [phone].

## 2018-06-15 NOTE — Progress Notes (Signed)
   Subjective:  Patient presents today status post ORIF calcaneal fracture right. DOS: 04/02/18. He reports continue pain that is exacerbated by weightbearing. He has been using the CAM boot as directed without difficulty. He states he was never contacted by physical therapy to set up an appointment. Patient is here for further evaluation and treatment.   History reviewed. No pertinent past medical history.    Objective/Physical Exam Neurovascular status intact.  There is a small area of dehiscence along the incision site measuring approximate 1 cm in length.  Superficial in nature with no sign of infectious process noted. No active bleeding noted. Moderate chronic edema noted to the surgical extremity.  Radiographic Exam:  Orthopedic hardware and osteotomies sites appear to be stable with routine healing.  Assessment: 1. s/p ORIF calcaneal fracture right. DOS: 04/02/18.    Plan of Care:  1. Patient was evaluated. X-Rays reviewed.  2. Today we are going to order physical therapy again 3 times per week x4 weeks. Patient was never contacted by physical therapy.  3. Continue weightbearing in CAM boot.  4. Return to clinic in 4 weeks.   Felecia Shelling, DPM [facility]  Dr. Felecia Shelling, DPM    [address]                                        [place], [place] [address]                Office [phone]  Fax [phone]

## 2018-06-18 ENCOUNTER — Telehealth: Payer: Self-pay | Admitting: *Deleted

## 2018-06-18 NOTE — Telephone Encounter (Signed)
Freda Munro - In-office states pt is a Workers' Comp pt, and had a note stating - [employer] Karenann Cai [phone], Case Manager-Sherry Amil Amen [phone] email [email or web address]. Lequita Halt states orders should be sent to [facility] for authorization. Pasty Arch - Records Coordinator emailed to [facility].

## 2018-06-22 ENCOUNTER — Encounter: Payer: Self-pay | Admitting: Podiatry

## 2018-06-24 ENCOUNTER — Telehealth: Payer: Self-pay | Admitting: *Deleted

## 2018-06-24 NOTE — Telephone Encounter (Signed)
Cordelia PenSherry - [employer] states they need a work note for pt's restrictions, faxed to her [phone]. Pt is to start therapy this Friday.

## 2018-06-25 ENCOUNTER — Telehealth: Payer: Self-pay | Admitting: *Deleted

## 2018-06-25 ENCOUNTER — Encounter: Payer: Self-pay | Admitting: *Deleted

## 2018-06-25 NOTE — Telephone Encounter (Signed)
Dr. Logan BoresEvans states restricted to weight bearing in a CAM boot as tolerated, rest as needed, no prolonged periods of standing, no lifting greater than 20 lbs. [facility] [employer].

## 2018-06-25 NOTE — Telephone Encounter (Signed)
Faxed restrictions note to [facility] [employer].

## 2018-06-30 ENCOUNTER — Other Ambulatory Visit: Payer: Self-pay

## 2018-06-30 MED ORDER — MELOXICAM 15 MG PO TABS: 15.0000 mg | ORAL_TABLET | Freq: Every day | ORAL | 0 refills | Status: AC

## 2018-07-08 ENCOUNTER — Ambulatory Visit (INDEPENDENT_AMBULATORY_CARE_PROVIDER_SITE_OTHER): Payer: Worker's Compensation | Admitting: Podiatry

## 2018-07-08 ENCOUNTER — Encounter: Payer: Self-pay | Admitting: Podiatry

## 2018-07-08 DIAGNOSIS — Z9889 Other specified postprocedural states: Secondary | ICD-10-CM

## 2018-07-08 DIAGNOSIS — S92011D Displaced fracture of body of right calcaneus, subsequent encounter for fracture with routine healing: Secondary | ICD-10-CM

## 2018-07-12 NOTE — Progress Notes (Signed)
   Subjective:  Patient presents today status post ORIF calcaneal fracture right. DOS: 04/02/18. He states he has improved significantly. He reports some mild pain today but states it is much improved since his last visit. He has been doing physical therapy and using the CAM boot as directed. He has been applying Betadine ointment daily. Patient is here for further evaluation and treatment.   History reviewed. No pertinent past medical history.    Objective/Physical Exam Neurovascular status intact.  There is a small area of dehiscence along the incision site measuring approximate 1 cm in length that is unchanged since his last visit.  Superficial in nature with no sign of infectious process noted. No active bleeding noted. Moderate chronic edema noted to the surgical extremity.  Assessment: 1. s/p ORIF calcaneal fracture right. DOS: 04/02/18.    Plan of Care:  1. Patient was evaluated.  2. Discontinue using CAM boot. Recommended good sneakers.  3. Continue physical therapy.  4. Continue using Betadine ointment daily with a bandage.  5. Return to clinic in 4 weeks.   Felecia ShellingBrent M. Evans, DPM [facility]  Dr. Felecia ShellingBrent M. Evans, DPM    [address]                                        [place], [place] [address]                Office [phone]  Fax [phone]

## 2018-07-13 ENCOUNTER — Encounter: Payer: Self-pay | Admitting: *Deleted

## 2018-07-13 ENCOUNTER — Telehealth: Payer: Self-pay | Admitting: Podiatry

## 2018-07-13 NOTE — Telephone Encounter (Signed)
Faxed restrictions note and note that pt could return to work 06/25/2018.

## 2018-07-13 NOTE — Telephone Encounter (Signed)
Insurance called requesting work note for pt. Since this is workers comp. they need a note to state what pt is able to do at work.   Fax# [phone]

## 2018-07-14 ENCOUNTER — Telehealth: Payer: Self-pay | Admitting: *Deleted

## 2018-07-14 NOTE — Telephone Encounter (Signed)
Entered in error

## 2018-07-14 NOTE — Telephone Encounter (Signed)
Cordelia PenSherry, nurse case manager with [facility] is requesting office visit note from  27 November be faxed to her. Fax number is [phone]. Phone number is [phone].

## 2018-08-17 ENCOUNTER — Encounter: Payer: Self-pay | Admitting: Podiatry

## 2018-08-17 ENCOUNTER — Ambulatory Visit (INDEPENDENT_AMBULATORY_CARE_PROVIDER_SITE_OTHER): Payer: Worker's Compensation

## 2018-08-17 ENCOUNTER — Ambulatory Visit (INDEPENDENT_AMBULATORY_CARE_PROVIDER_SITE_OTHER): Payer: Worker's Compensation | Admitting: Podiatry

## 2018-08-17 DIAGNOSIS — S92011D Displaced fracture of body of right calcaneus, subsequent encounter for fracture with routine healing: Secondary | ICD-10-CM

## 2018-08-18 ENCOUNTER — Telehealth: Payer: Self-pay | Admitting: Podiatry

## 2018-08-18 NOTE — Telephone Encounter (Signed)
[name] with [employer] is requesting office visit notes from date of service  17 August 2018. Fax number [phone].

## 2018-08-19 NOTE — Progress Notes (Signed)
   Subjective:  Patient presents today status post ORIF calcaneal fracture right. DOS: 04/02/18. He states he is doing well. He reports some mild tenderness but states it has improved significantly since the surgery. He denies any modifying factors or new complaints at this time. Patient is here for further evaluation and treatment.   History reviewed. No pertinent past medical history.    Objective/Physical Exam Neurovascular status intact.  There is a small area of dehiscence along the incision site measuring approximate 1 cm in length that is unchanged since his last visit.  Superficial in nature with no sign of infectious process noted. No active bleeding noted. Moderate chronic edema noted to the surgical extremity.  Radiographic Exam:  Orthopedic hardware and osteotomies sites appear to be stable with routine healing.  Assessment: 1. s/p ORIF calcaneal fracture right. DOS: 04/02/18.    Plan of Care:  1. Patient was evaluated. X-Rays reviewed.  2. Expected return to work on 10/03/2018, 6 months after surgery.  3. 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  4. Recommended good shoe gear.  5. Return to clinic as needed.    Felecia Shelling, DPM [facility]  Dr. Felecia Shelling, DPM    [address]                                        [place], [place] [address]                Office [phone]  Fax [phone]

## 2018-08-20 ENCOUNTER — Telehealth: Payer: Self-pay | Admitting: Podiatry

## 2018-08-20 NOTE — Telephone Encounter (Signed)
[name], RN with [employer] wants ov note from 06 January. Fax to [phone].

## 2018-08-24 ENCOUNTER — Telehealth: Payer: Self-pay | Admitting: Podiatry

## 2018-08-24 NOTE — Telephone Encounter (Signed)
Altamease Oiler, [employer]. Calling about medical records listed from  06 January. If you could call me back about those at [phone]. Thank you.

## 2018-08-28 ENCOUNTER — Telehealth: Payer: Self-pay | Admitting: Podiatry

## 2018-08-28 NOTE — Telephone Encounter (Signed)
Called and confirmed with Peter Black that we had received the 25R form. I told her that Peter Black, who does our FMLA/Short Term Disability papers is only in the office on Wednesday's and Thursday's but I put the form in her folder.

## 2018-08-28 NOTE — Telephone Encounter (Signed)
Sherry with [employer] called to see if we received fax for the 25R for the industrial commission that was faxed over yesterday. My fax number is [phone].

## 2018-08-28 NOTE — Telephone Encounter (Signed)
Peter Black states Dr. Logan Bores has released pt to go back to work on 22 February.

## 2018-10-19 ENCOUNTER — Other Ambulatory Visit: Payer: Self-pay | Admitting: Podiatry

## 2018-10-19 ENCOUNTER — Ambulatory Visit (INDEPENDENT_AMBULATORY_CARE_PROVIDER_SITE_OTHER): Payer: Worker's Compensation | Admitting: Podiatry

## 2018-10-19 ENCOUNTER — Encounter: Payer: Self-pay | Admitting: Podiatry

## 2018-10-19 ENCOUNTER — Ambulatory Visit (INDEPENDENT_AMBULATORY_CARE_PROVIDER_SITE_OTHER): Payer: Worker's Compensation

## 2018-10-19 DIAGNOSIS — M19171 Post-traumatic osteoarthritis, right ankle and foot: Secondary | ICD-10-CM

## 2018-10-19 DIAGNOSIS — M79671 Pain in right foot: Secondary | ICD-10-CM

## 2018-10-19 DIAGNOSIS — S92011D Displaced fracture of body of right calcaneus, subsequent encounter for fracture with routine healing: Secondary | ICD-10-CM

## 2018-10-19 MED ORDER — DICLOFENAC SODIUM 75 MG PO TBEC: 75.0000 mg | DELAYED_RELEASE_TABLET | Freq: Two times a day (BID) | ORAL | 1 refills | Status: DC

## 2018-10-22 NOTE — Progress Notes (Signed)
   Subjective:  Patient presents today status post ORIF calcaneal fracture right. DOS: 04/02/18. He reports some right foot pain since starting work about one week ago. He states that three days later the pain was intense. He reports associated swelling. 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 He has not done anything recently for pain. Patient is here for further evaluation and treatment.   History reviewed. No pertinent past medical history.    Objective/Physical Exam Neurovascular status intact.  Skin incisions appear to be well coapted. No sign of infectious process noted. No dehiscence. No active bleeding noted. Moderate edema noted to the surgical extremity. Limited ROM noted with inversion and eversion consistent with STJ posttraumatic arthritis  Radiographic Exam:  Orthopedic hardware and osteotomies sites appear to be stable with routine healing. DJD noted to the subtalar joint  Assessment: 1. s/p ORIF calcaneal fracture right. DOS: 04/02/18.  2. Post-traumatic arthritis subtalar joint right    Plan of Care:  1. Patient was evaluated. X-Rays reviewed.  2. Declined injections.  3. Prescription for Diclofenac provided to patient.  4. Ankle brace dispensed. 5. May resume full activity with no restrictions.  6. Return to clinic as needed.     Felecia Shelling, DPM [facility]  Dr. Felecia Shelling, DPM    [address]                                        [place], [place] [address]                Office [phone]  Fax [phone]

## 2018-11-04 ENCOUNTER — Ambulatory Visit (INDEPENDENT_AMBULATORY_CARE_PROVIDER_SITE_OTHER): Payer: Worker's Compensation | Admitting: Podiatry

## 2018-11-04 ENCOUNTER — Encounter: Payer: Self-pay | Admitting: Podiatry

## 2018-11-04 ENCOUNTER — Other Ambulatory Visit: Payer: Self-pay

## 2018-11-04 ENCOUNTER — Ambulatory Visit (INDEPENDENT_AMBULATORY_CARE_PROVIDER_SITE_OTHER): Payer: Worker's Compensation

## 2018-11-04 DIAGNOSIS — R600 Localized edema: Secondary | ICD-10-CM | POA: Diagnosis not present

## 2018-11-04 DIAGNOSIS — S99911A Unspecified injury of right ankle, initial encounter: Secondary | ICD-10-CM | POA: Diagnosis not present

## 2018-11-04 DIAGNOSIS — M79671 Pain in right foot: Secondary | ICD-10-CM | POA: Diagnosis not present

## 2018-11-04 MED ORDER — HYDROCODONE-ACETAMINOPHEN 5-325 MG PO TABS: 1.0000 | ORAL_TABLET | Freq: Three times a day (TID) | ORAL | 0 refills | Status: AC

## 2018-11-04 NOTE — Progress Notes (Addendum)
   HPI: 38 year old male presents the office today for an acute injury to his right foot and ankle.  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  This is the same foot that we perform surgical ORIF of the calcaneus. DOS: 04/02/2018.  Patient states that injury occurred on 11/02/2018.  Patient has been unable to bear weight on the foot since the injury 2 days ago.  He presents for further treatment evaluation  History reviewed. No pertinent past medical history.   Physical Exam: General: The patient is alert and oriented x3 in no acute distress.  Dermatology: Skin is warm, dry and supple bilateral lower extremities. Negative for open lesions or macerations.  Skin incisions to the surgical foot are healed.  Vascular: Palpable pedal pulses bilaterally. No edema or erythema noted. Capillary refill within normal limits.  Neurological: Epicritic and protective threshold grossly intact bilaterally.   Musculoskeletal Exam: Range of motion within normal limits to all pedal and ankle joints bilateral. Muscle strength 5/5 in all groups bilateral.  Significant tenderness to palpation noted to the lateral aspect of the right heel where the orthopedic hardware is located  Radiographic Exam:  Normal osseous mineralization.  Orthopedic plate and screws appear to be intact without movement.  No acute fracture identified.  Assessment: 1.  Right foot sprain 2.  H/o ORIF RT calcaneus. DOS: 04/02/2018   Plan of Care:  1. Patient evaluated. X-Rays reviewed.  2.  Today explained the patient that if he does feel that the orthopedic hardware is causing symptoms and pain to the lateral aspect of the heel we can remove the plate and screws.  Patient does not want to do that at the moment so we will wait an additional 3 months to see if his pain improves. 3.  Note for work provided today no work in additional 3 months.  Medically necessary.  After 3 months we  will let the patient return to work with restrictions 4.  Prescription for Vicodin 5/325mg  #30 q8h prn pain. 5.  Return to clinic in 3 months      Felecia Shelling, DPM [facility]  Dr. Felecia Shelling, DPM    [address] [place], [place] [address]                Office [phone]  Fax [phone]

## 2018-11-18 ENCOUNTER — Telehealth: Payer: Self-pay | Admitting: Podiatry

## 2018-11-18 NOTE — Telephone Encounter (Signed)
[employer]   Needs faxed Office note form March 25th and any orders Dr Janae Bridgeman have entered that day. [name] stated she did get the work note but no office note. Fax: [phone]

## 2018-11-23 ENCOUNTER — Telehealth: Payer: Self-pay | Admitting: *Deleted

## 2018-11-23 NOTE — Telephone Encounter (Signed)
Nettie Elm, Nurse Case manager got the note of 11/04/2018 stating "no work for 3 weeks", need an updated work note with restrictions specific to the ankle sprain.

## 2018-11-25 ENCOUNTER — Encounter: Payer: Self-pay | Admitting: Podiatry

## 2018-12-09 ENCOUNTER — Other Ambulatory Visit: Payer: Self-pay

## 2018-12-09 ENCOUNTER — Ambulatory Visit (INDEPENDENT_AMBULATORY_CARE_PROVIDER_SITE_OTHER): Payer: Worker's Compensation | Admitting: Podiatry

## 2018-12-09 DIAGNOSIS — M79671 Pain in right foot: Secondary | ICD-10-CM | POA: Diagnosis not present

## 2018-12-09 DIAGNOSIS — M19171 Post-traumatic osteoarthritis, right ankle and foot: Secondary | ICD-10-CM

## 2018-12-09 DIAGNOSIS — S92011D Displaced fracture of body of right calcaneus, subsequent encounter for fracture with routine healing: Secondary | ICD-10-CM

## 2018-12-09 NOTE — Progress Notes (Signed)
   HPI: Patient presents the office today to discuss and clarify regarding his return to work date.  Last visit on 11/04/2018 we decided the patient would refrain from work for an additional 3 months.  There was a typo in the last note stating 3 weeks, instead of 3 months.  The patient was required to return to work and he is having a severe amount of pain and tenderness to the surgical foot.  No past medical history on file.   Physical Exam: General: The patient is alert and oriented x3 in no acute distress.  Dermatology: Skin is warm, dry and supple bilateral lower extremities. Negative for open lesions or macerations.  Skin incisions to the surgical foot are healed.  Vascular: Palpable pedal pulses bilaterally. No edema or erythema noted. Capillary refill within normal limits.  Neurological: Epicritic and protective threshold grossly intact bilaterally.   Musculoskeletal Exam: Range of motion within normal limits to all pedal and ankle joints bilateral with exception of limited range of motion with inversion and eversion of the subtalar joint consistent with a posttraumatic arthritis. Muscle strength 5/5 in all groups bilateral.  Significant tenderness to palpation noted to the lateral aspect of the right heel where the orthopedic hardware is located  Radiographic Exam:  Normal osseous mineralization.  Orthopedic plate and screws appear to be intact without movement.  No acute fracture identified.  Assessment: 1.  Right foot sprain 2.  H/o ORIF RT calcaneus. DOS: 04/02/2018   Plan of Care:  1. Patient evaluated. X-Rays reviewed.  2.  Today an addendum was placed on the last visit changing 3 weeks to 3 months.  This is medically necessary to allow for healing of his right foot.  Patient underwent severe comminuted fracture of the calcaneus.  I did explain the patient that he will likely have pain and tenderness due to the posttraumatic arthritis with the amount of work that he does working  on his feet on uneven ground. 3.  Refrain from work in additional 3 months.  Medically necessary.  After 3 months we will let the patient return to work with restrictions 4.  Return to clinic in 3 months     Felecia Shelling, DPM [facility]  Dr. Felecia Shelling, DPM    [address] [place], [place] [address]                Office [phone]  Fax [phone]

## 2019-01-14 IMAGING — CT CT ABD-PELV W/ CM
3 of 5 series · 14 of 36 positions shown, 17 images · IV contrast (omnipaque)
Comparison: None.

CLINICAL DATA: Fall from approximately 9 feet, landed on feet.
Abdominal trauma, blunt, stable.

EXAM:
CT CHEST, ABDOMEN, AND PELVIS WITH CONTRAST
TECHNIQUE: Multidetector CT imaging of the chest, abdomen and pelvis was
performed following the standard protocol during bolus
administration of intravenous contrast.
CONTRAST:  100mL OMNIPAQUE IOHEXOL 300 MG/ML  SOLN

[Series 2: cap with · axial · 0.77mm/px · z∈[+713,+1228]mm · 9 of 129 slices shown, 12 images]
[im 13/129  mediastinal]
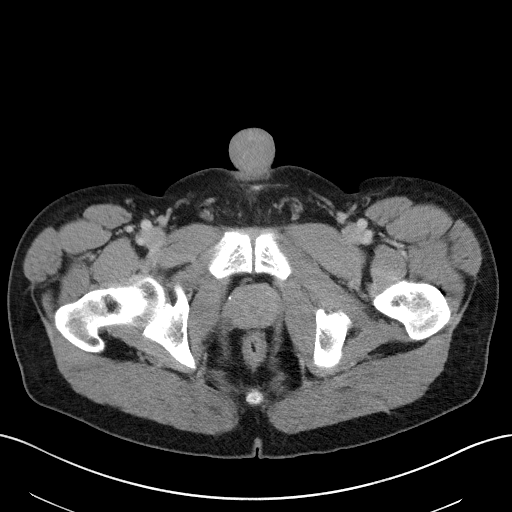
[im 13/129  lung]
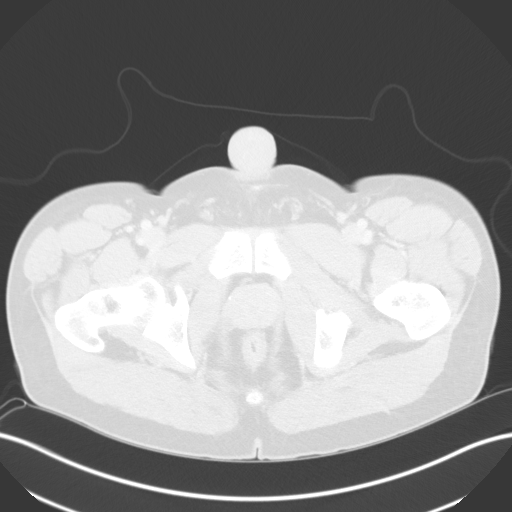
[im 26/129  lung]
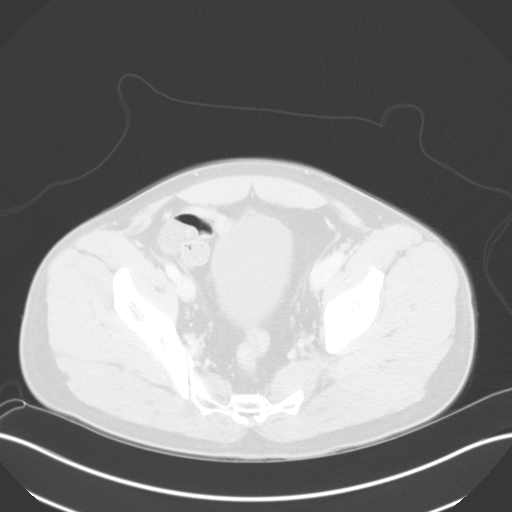
[im 39/129  lung]
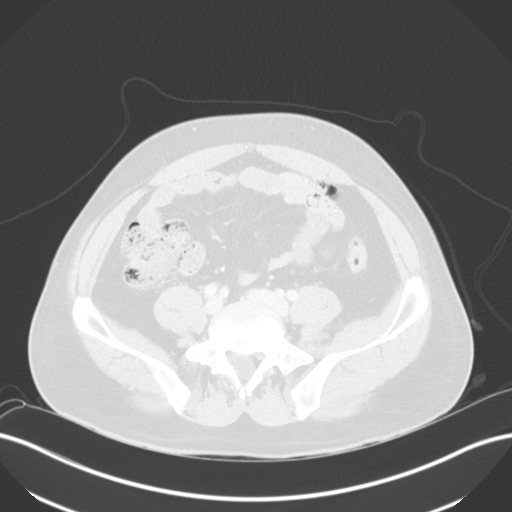
[im 52/129  lung]
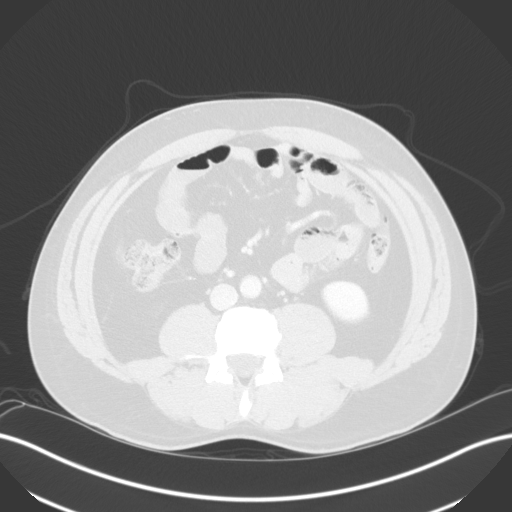
[im 65/129  mediastinal]
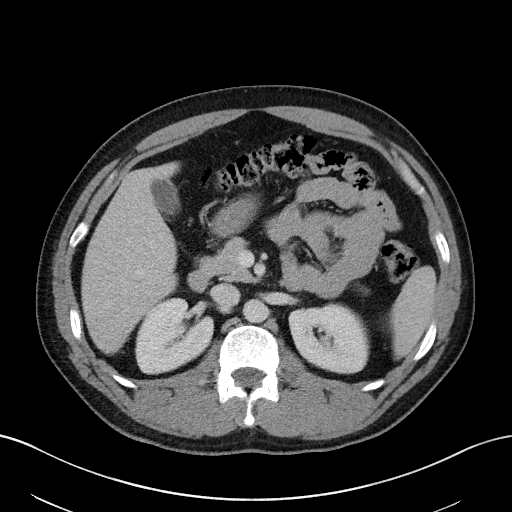
[im 65/129  lung]
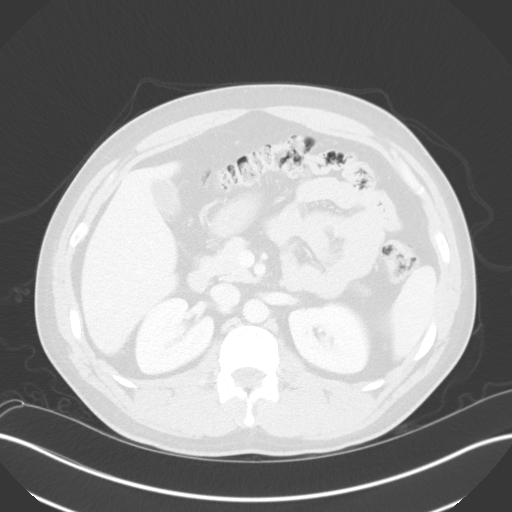
[im 77/129  lung]
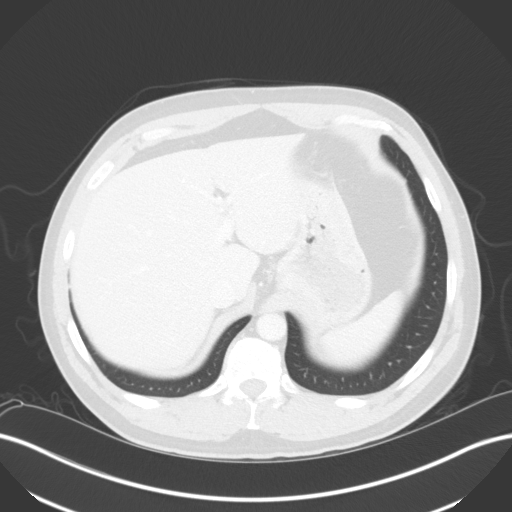
[im 90/129  lung]
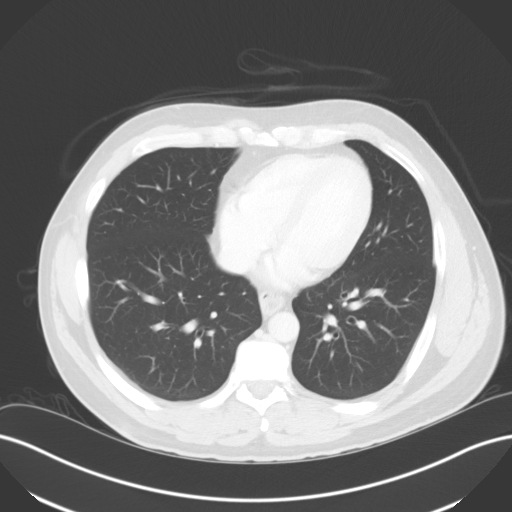
[im 103/129  lung]
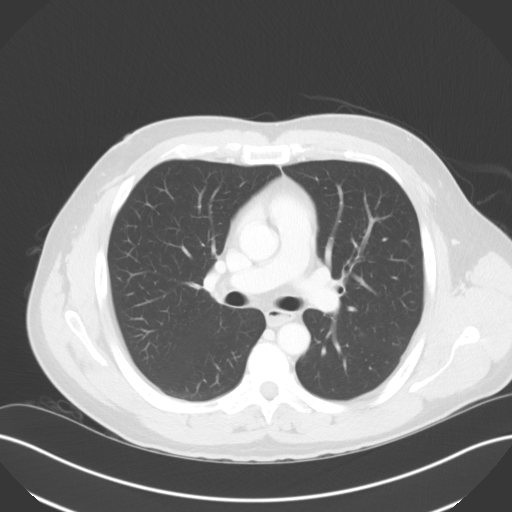
[im 116/129  mediastinal]
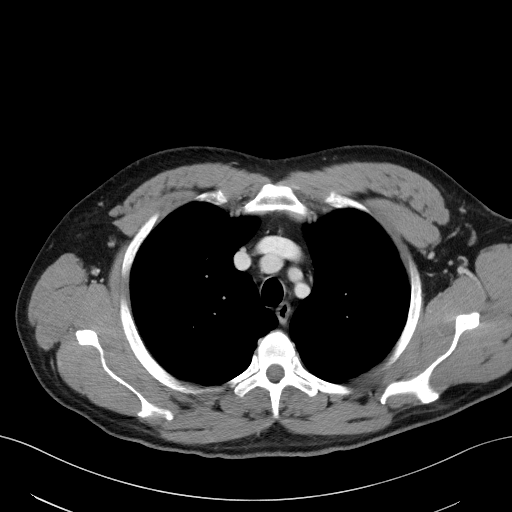
[im 116/129  lung]
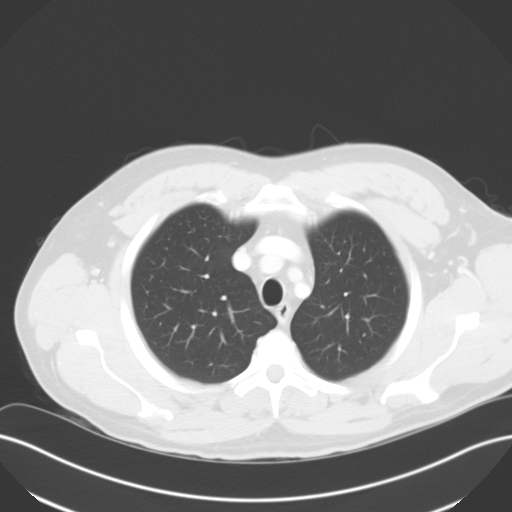

[Series 4: lung · axial · 0.77mm/px · z∈[+1009,+1057]mm · 2 of 154 slices shown]
[im 12/154  lung]
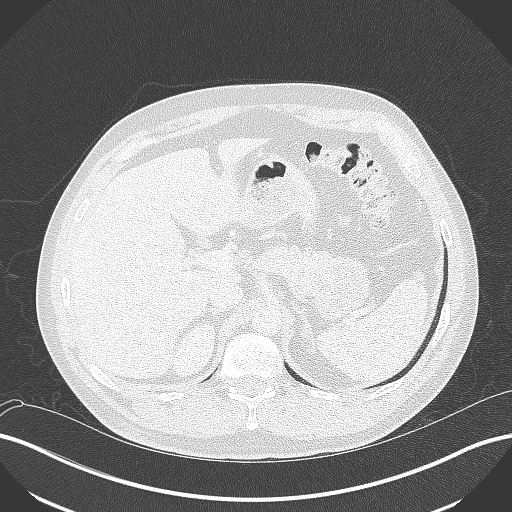
[im 36/154  lung]
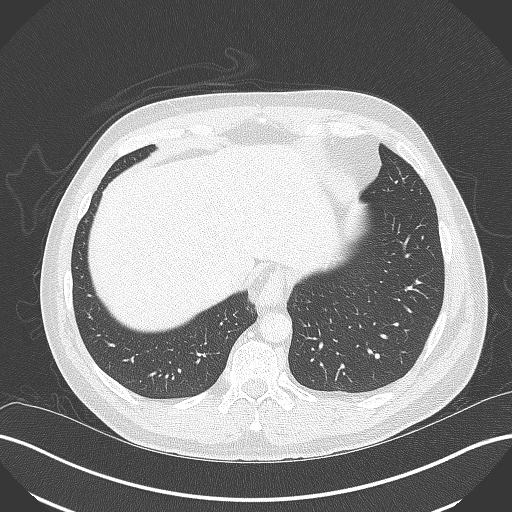

[Series 5: coronals · coronal · 0.82mm/px · 3 of 140 slices shown]
[im 28/140  lung]
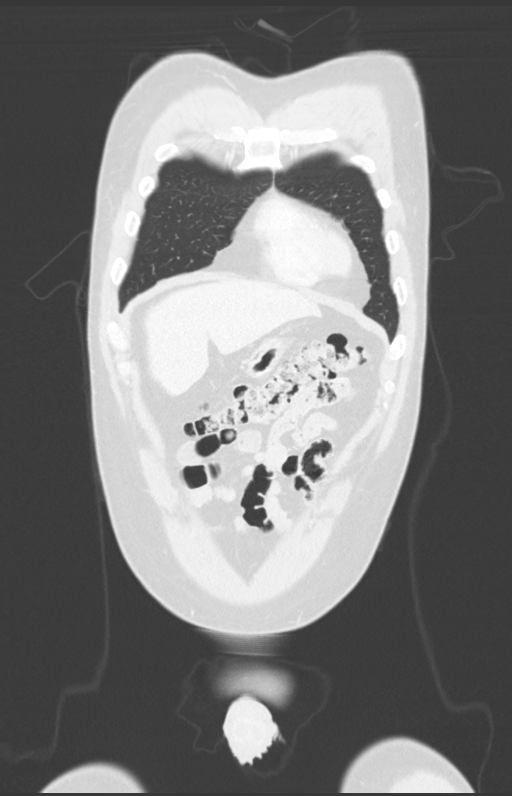
[im 56/140  lung]
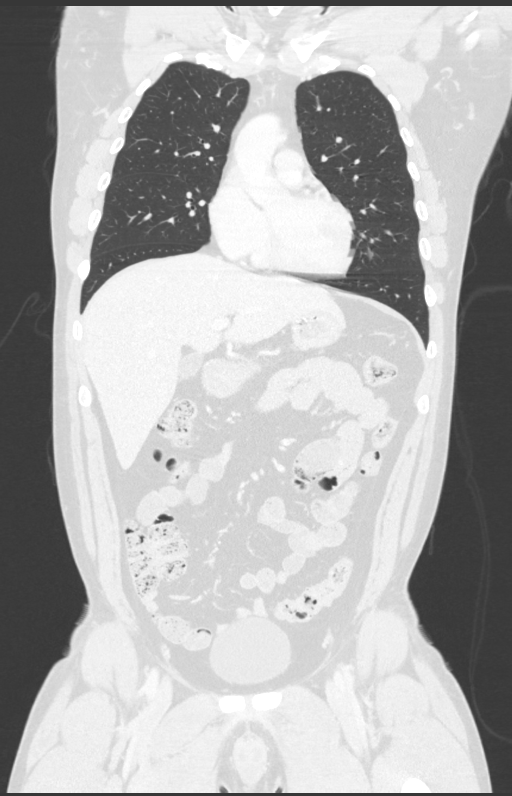
[im 84/140  lung]
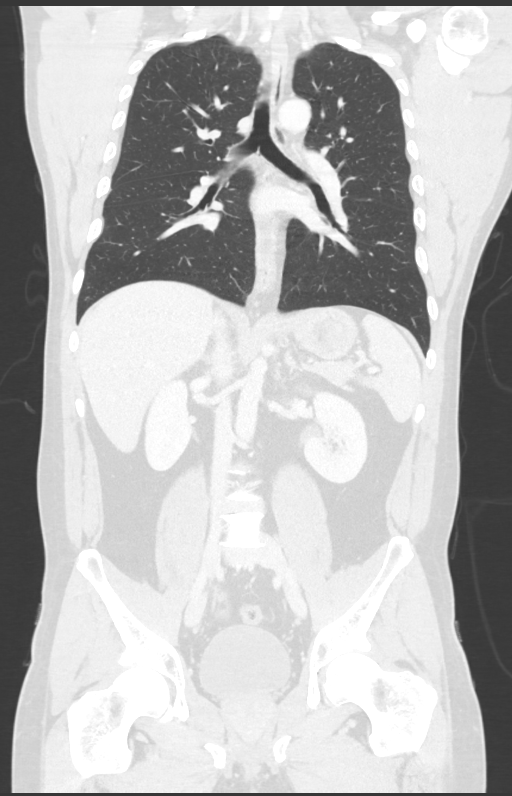

[14 of 36 positions shown; findings below may reference images not displayed]

FINDINGS: CT CHEST FINDINGS

Cardiovascular: Thoracic aorta appears intact and normal in
configuration. Heart size is normal. No pericardial effusion.

Mediastinum/Nodes: No hemorrhage or edema within the mediastinum. No
mass or enlarged lymph nodes within the mediastinum or perihilar
regions. Esophagus appears normal. Trachea and central bronchi are
unremarkable.

Lungs/Pleura: Lungs are clear.  No pleural effusion or pneumothorax.

Musculoskeletal: No osseous fracture or dislocation seen.

CT ABDOMEN PELVIS FINDINGS

Hepatobiliary: No hepatic injury or perihepatic hematoma.
Gallbladder is unremarkable

Pancreas: Unremarkable. No pancreatic ductal dilatation or
surrounding inflammatory changes.

Spleen: No splenic injury or perisplenic hematoma.

Adrenals/Urinary Tract: No adrenal hemorrhage or renal injury
identified. Bladder is unremarkable.

Stomach/Bowel: No dilated large or small bowel loops. No bowel wall
thickening or evidence of bowel wall injury. Appendix is normal.
Stomach is unremarkable.

Vascular/Lymphatic: Abdominal aorta appears intact and normal in
configuration. No evidence of vascular injury within the abdomen or
pelvis. No enlarged lymph nodes seen in the abdomen or pelvis.

Reproductive: Prostate is unremarkable.

Other: No free fluid or hemorrhage within the abdomen or pelvis. No
free intraperitoneal air.

Musculoskeletal: No osseous fracture or dislocation seen. Incidental
note made of a partial sacralization of the lowest lumbar vertebral
body on the LEFT.
IMPRESSION: 1. No acute findings within the chest, abdomen or pelvis.
2. No osseous fracture or dislocation seen. Dedicated thoracic spine
CT report to follow.

## 2019-02-03 ENCOUNTER — Encounter: Payer: Self-pay | Admitting: Podiatry

## 2019-02-03 ENCOUNTER — Other Ambulatory Visit: Payer: Self-pay

## 2019-02-03 ENCOUNTER — Ambulatory Visit (INDEPENDENT_AMBULATORY_CARE_PROVIDER_SITE_OTHER): Payer: Self-pay | Admitting: Podiatry

## 2019-02-03 VITALS — Temp 98.7°F

## 2019-02-03 DIAGNOSIS — S92011D Displaced fracture of body of right calcaneus, subsequent encounter for fracture with routine healing: Secondary | ICD-10-CM

## 2019-02-03 DIAGNOSIS — M79671 Pain in right foot: Secondary | ICD-10-CM

## 2019-02-03 DIAGNOSIS — M19171 Post-traumatic osteoarthritis, right ankle and foot: Secondary | ICD-10-CM

## 2019-02-03 NOTE — Progress Notes (Signed)
   HPI: Patient presents the office today to discuss and clarify regarding his return to work date.  Patient was last seen on 12/09/2018.  At that time he was originally scheduled to return to clinic in approximately 3 months.  He says that rest has helped to alleviate some of his symptoms of his right foot.  Patient is status post ORIF calcaneal fracture on 04/02/2018.  History reviewed. No pertinent past medical history.   Physical Exam: General: The patient is alert and oriented x3 in no acute distress.  Dermatology: Skin is warm, dry and supple bilateral lower extremities. Negative for open lesions or macerations.  Skin incisions to the surgical foot are healed.  Vascular: Palpable pedal pulses bilaterally. No edema or erythema noted. Capillary refill within normal limits.  Neurological: Epicritic and protective threshold grossly intact bilaterally.   Musculoskeletal Exam: Range of motion within normal limits to all pedal and ankle joints bilateral with exception of limited range of motion with inversion and eversion of the subtalar joint consistent with a posttraumatic arthritis. Muscle strength 5/5 in all groups bilateral.  Significant tenderness to palpation noted to the lateral aspect of the right heel where the orthopedic hardware is located  Assessment: 1.  Right foot sprain-resolved 2.  H/o ORIF RT calcaneus. DOS: 04/02/2018   Plan of Care:  1. Patient evaluated.  Patient has permanent posttraumatic arthritis and may need additional surgical intervention in the future. 2.  Recommend that the patient change work career into a job that is less strenuous and fatiguing on the surgical foot.  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  Patient may return to work beginning May 06, 2019. 4.  Note was provided for the patient today 5.  Podiatry will sign off at this time.  Patient is  reached maximum medical improvement.  Return to clinic as needed   Edrick Kins, DPM [facility]  Dr. Edrick Kins, DPM    [address], [place] [address]                Office [phone]  Fax [phone]

## 2019-02-08 ENCOUNTER — Ambulatory Visit: Payer: Self-pay | Admitting: Podiatry

## 2019-02-15 ENCOUNTER — Telehealth: Payer: Self-pay | Admitting: Podiatry

## 2019-02-15 NOTE — Telephone Encounter (Signed)
Pt wanted to ask a few questions about his heel/ x ray. Please call patient

## 2019-02-16 NOTE — Telephone Encounter (Signed)
I called Peter Black and asked how I could help and he stated he needed copy of the MRI disc but he thought Dr. Amalia Hailey had given to him and he would try to find and call us again.

## 2019-03-10 ENCOUNTER — Ambulatory Visit: Payer: Self-pay | Admitting: Podiatry

## 2019-08-30 ENCOUNTER — Other Ambulatory Visit: Payer: Self-pay | Admitting: Podiatry
